# Patient Record
Sex: Female | Born: 1995 | Race: Black or African American | Hispanic: No | Marital: Single | Smoking: Former smoker
Health system: Southern US, Community
[De-identification: ages and names within clinical notes are randomized; demographics above are authoritative.]

## PROBLEM LIST (undated history)

## (undated) DIAGNOSIS — O139 Gestational [pregnancy-induced] hypertension without significant proteinuria, unspecified trimester: Secondary | ICD-10-CM

## (undated) DIAGNOSIS — I10 Essential (primary) hypertension: Secondary | ICD-10-CM

---
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
## 2019-11-07 LAB — OB RESULTS CONSOLE ABO/RH: RH Type: POSITIVE

## 2019-11-07 LAB — OB RESULTS CONSOLE GC/CHLAMYDIA
Chlamydia: NEGATIVE
Gonorrhea: NEGATIVE

## 2019-11-07 LAB — OB RESULTS CONSOLE RUBELLA ANTIBODY, IGM: Rubella: IMMUNE

## 2019-11-07 LAB — OB RESULTS CONSOLE ANTIBODY SCREEN: Antibody Screen: NEGATIVE

## 2019-11-07 LAB — OB RESULTS CONSOLE HIV ANTIBODY (ROUTINE TESTING): HIV: NONREACTIVE

## 2019-11-07 LAB — OB RESULTS CONSOLE HEPATITIS B SURFACE ANTIGEN: Hepatitis B Surface Ag: NEGATIVE

## 2019-11-07 LAB — OB RESULTS CONSOLE RPR: RPR: NONREACTIVE

## 2019-11-19 ENCOUNTER — Other Ambulatory Visit (HOSPITAL_COMMUNITY): Payer: Self-pay | Admitting: Obstetrics and Gynecology

## 2019-11-19 DIAGNOSIS — Z3A19 19 weeks gestation of pregnancy: Secondary | ICD-10-CM

## 2019-11-19 DIAGNOSIS — Z3689 Encounter for other specified antenatal screening: Secondary | ICD-10-CM

## 2019-12-17 ENCOUNTER — Ambulatory Visit (HOSPITAL_COMMUNITY): Payer: Medicaid Other | Attending: Obstetrics and Gynecology

## 2019-12-17 ENCOUNTER — Other Ambulatory Visit: Payer: Self-pay

## 2019-12-17 DIAGNOSIS — Z3A19 19 weeks gestation of pregnancy: Secondary | ICD-10-CM | POA: Diagnosis not present

## 2019-12-17 DIAGNOSIS — Z363 Encounter for antenatal screening for malformations: Secondary | ICD-10-CM

## 2019-12-17 DIAGNOSIS — Z3689 Encounter for other specified antenatal screening: Secondary | ICD-10-CM | POA: Diagnosis not present

## 2020-04-23 ENCOUNTER — Other Ambulatory Visit: Payer: Self-pay

## 2020-04-23 ENCOUNTER — Encounter (HOSPITAL_COMMUNITY): Payer: Self-pay | Admitting: Obstetrics & Gynecology

## 2020-04-23 ENCOUNTER — Inpatient Hospital Stay (HOSPITAL_COMMUNITY)
Admission: AD | Admit: 2020-04-23 | Discharge: 2020-04-26 | DRG: 788 | Disposition: A | Discharge status: Home / Self Care | Payer: Medicaid Other | Attending: Obstetrics & Gynecology | Admitting: Obstetrics & Gynecology

## 2020-04-23 DIAGNOSIS — Z20822 Contact with and (suspected) exposure to covid-19: Secondary | ICD-10-CM | POA: Diagnosis present

## 2020-04-23 DIAGNOSIS — Z98891 History of uterine scar from previous surgery: Secondary | ICD-10-CM

## 2020-04-23 DIAGNOSIS — O9081 Anemia of the puerperium: Secondary | ICD-10-CM | POA: Diagnosis not present

## 2020-04-23 DIAGNOSIS — O9902 Anemia complicating childbirth: Secondary | ICD-10-CM

## 2020-04-23 DIAGNOSIS — Z3A37 37 weeks gestation of pregnancy: Secondary | ICD-10-CM

## 2020-04-23 DIAGNOSIS — O1414 Severe pre-eclampsia complicating childbirth: Principal | ICD-10-CM | POA: Diagnosis present

## 2020-04-23 DIAGNOSIS — O99214 Obesity complicating childbirth: Secondary | ICD-10-CM | POA: Diagnosis present

## 2020-04-23 DIAGNOSIS — Z87891 Personal history of nicotine dependence: Secondary | ICD-10-CM | POA: Diagnosis not present

## 2020-04-23 DIAGNOSIS — Z3490 Encounter for supervision of normal pregnancy, unspecified, unspecified trimester: Secondary | ICD-10-CM | POA: Diagnosis present

## 2020-04-23 DIAGNOSIS — O134 Gestational [pregnancy-induced] hypertension without significant proteinuria, complicating childbirth: Secondary | ICD-10-CM | POA: Diagnosis present

## 2020-04-23 DIAGNOSIS — O1490 Unspecified pre-eclampsia, unspecified trimester: Secondary | ICD-10-CM | POA: Diagnosis present

## 2020-04-23 LAB — COMPREHENSIVE METABOLIC PANEL
ALT: 17 U/L (ref 0–44)
AST: 22 U/L (ref 15–41)
Albumin: 2.5 g/dL — ABNORMAL LOW (ref 3.5–5.0)
Alkaline Phosphatase: 155 U/L — ABNORMAL HIGH (ref 38–126)
Anion gap: 10 (ref 5–15)
BUN: 7 mg/dL (ref 6–20)
CO2: 20 mmol/L — ABNORMAL LOW (ref 22–32)
Calcium: 8.7 mg/dL — ABNORMAL LOW (ref 8.9–10.3)
Chloride: 105 mmol/L (ref 98–111)
Creatinine, Ser: 0.9 mg/dL (ref 0.44–1.00)
GFR calc Af Amer: >60 mL/min (ref >60)
GFR calc non Af Amer: >60 mL/min (ref >60)
Glucose, Bld: 92 mg/dL (ref 70–99)
Potassium: 3.4 mmol/L — ABNORMAL LOW (ref 3.5–5.1)
Sodium: 135 mmol/L (ref 135–145)
Total Bilirubin: 0.4 mg/dL (ref 0.3–1.2)
Total Protein: 5.9 g/dL — ABNORMAL LOW (ref 6.5–8.1)

## 2020-04-23 LAB — TYPE AND SCREEN
ABO/RH(D): B POS
Antibody Screen: NEGATIVE

## 2020-04-23 LAB — CBC
HCT: 32.9 % — ABNORMAL LOW (ref 36.0–46.0)
Hemoglobin: 10.8 g/dL — ABNORMAL LOW (ref 12.0–15.0)
MCH: 30.6 pg (ref 26.0–34.0)
MCHC: 32.8 g/dL (ref 30.0–36.0)
MCV: 93.2 fL (ref 80.0–100.0)
Platelets: 238 10*3/uL (ref 150–400)
RBC: 3.53 MIL/uL — ABNORMAL LOW (ref 3.87–5.11)
RDW: 13.2 % (ref 11.5–15.5)
WBC: 14.6 10*3/uL — ABNORMAL HIGH (ref 4.0–10.5)
nRBC: 0.1 % (ref 0.0–0.2)

## 2020-04-23 LAB — RESPIRATORY PANEL BY RT PCR (FLU A&B, COVID)
Influenza A by PCR: NEGATIVE
Influenza B by PCR: NEGATIVE
SARS Coronavirus 2 by RT PCR: NEGATIVE

## 2020-04-23 LAB — URIC ACID: Uric Acid, Serum: 7.1 mg/dL (ref 2.5–7.1)

## 2020-04-23 MED ORDER — OXYTOCIN-SODIUM CHLORIDE 30-0.9 UT/500ML-% IV SOLN: 2.5000 [IU]/h | INTRAVENOUS | Status: DC

## 2020-04-23 MED ORDER — SOD CITRATE-CITRIC ACID 500-334 MG/5ML PO SOLN
30.0000 mL | ORAL | Status: DC | PRN
  Administered 2020-04-24: 30 mL via ORAL
  Filled 2020-04-23: qty 15

## 2020-04-23 MED ORDER — TERBUTALINE SULFATE 1 MG/ML IJ SOLN
0.2500 mg | Freq: Once | INTRAMUSCULAR | Status: DC | PRN
  Filled 2020-04-23: qty 1

## 2020-04-23 MED ORDER — SODIUM CHLORIDE 0.9 % IV SOLN: 250.0000 mL | INTRAVENOUS | Status: DC | PRN

## 2020-04-23 MED ORDER — MAGNESIUM SULFATE 40 GM/1000ML IV SOLN
2.0000 g/h | INTRAVENOUS | Status: AC
  Administered 2020-04-23 – 2020-04-25 (×3): 2 g/h via INTRAVENOUS
  Filled 2020-04-23 (×2): qty 1000

## 2020-04-23 MED ORDER — SODIUM CHLORIDE 0.9% FLUSH: 3.0000 mL | Freq: Two times a day (BID) | INTRAVENOUS | Status: DC

## 2020-04-23 MED ORDER — OXYTOCIN 10 UNIT/ML IJ SOLN: 10.0000 [IU] | Freq: Once | INTRAMUSCULAR | Status: DC

## 2020-04-23 MED ORDER — MAGNESIUM SULFATE 40 GM/1000ML IV SOLN
INTRAVENOUS | Status: AC
  Administered 2020-04-23: 4 g via INTRAVENOUS
  Filled 2020-04-23: qty 1000

## 2020-04-23 MED ORDER — HYDRALAZINE HCL 20 MG/ML IJ SOLN
10.0000 mg | INTRAMUSCULAR | Status: DC | PRN
  Administered 2020-04-23: 10 mg via INTRAVENOUS
  Filled 2020-04-23: qty 1

## 2020-04-23 MED ORDER — LIDOCAINE HCL (PF) 1 % IJ SOLN: 30.0000 mL | INTRAMUSCULAR | Status: DC | PRN

## 2020-04-23 MED ORDER — OXYTOCIN-SODIUM CHLORIDE 30-0.9 UT/500ML-% IV SOLN
1.0000 m[IU]/min | INTRAVENOUS | Status: DC
  Administered 2020-04-24: 2 m[IU]/min via INTRAVENOUS
  Filled 2020-04-23: qty 500

## 2020-04-23 MED ORDER — OXYTOCIN BOLUS FROM INFUSION: 333.0000 mL | Freq: Once | INTRAVENOUS | Status: DC

## 2020-04-23 MED ORDER — LABETALOL HCL 5 MG/ML IV SOLN
20.0000 mg | INTRAVENOUS | Status: DC | PRN
  Administered 2020-04-23: 20 mg via INTRAVENOUS
  Filled 2020-04-23: qty 4

## 2020-04-23 MED ORDER — LABETALOL HCL 5 MG/ML IV SOLN
40.0000 mg | INTRAVENOUS | Status: DC | PRN
  Administered 2020-04-23: 40 mg via INTRAVENOUS
  Filled 2020-04-23: qty 8

## 2020-04-23 MED ORDER — MAGNESIUM SULFATE BOLUS VIA INFUSION
4.0000 g | Freq: Once | INTRAVENOUS | Status: AC
  Filled 2020-04-23: qty 1000

## 2020-04-23 MED ORDER — SODIUM CHLORIDE 0.9% FLUSH: 3.0000 mL | INTRAVENOUS | Status: DC | PRN

## 2020-04-23 MED ORDER — LACTATED RINGERS IV SOLN
INTRAVENOUS | Status: DC
  Administered 2020-04-24: 75 mL via INTRAVENOUS

## 2020-04-23 MED ORDER — LACTATED RINGERS IV SOLN: 500.0000 mL | INTRAVENOUS | Status: DC | PRN

## 2020-04-23 MED ORDER — MISOPROSTOL 50MCG HALF TABLET
50.0000 ug | ORAL_TABLET | ORAL | Status: DC
  Administered 2020-04-23 (×2): 50 ug via ORAL
  Filled 2020-04-23 (×2): qty 1

## 2020-04-23 MED ORDER — LABETALOL HCL 5 MG/ML IV SOLN: 80.0000 mg | INTRAVENOUS | Status: DC | PRN

## 2020-04-23 MED ORDER — ONDANSETRON HCL 4 MG/2ML IJ SOLN: 4.0000 mg | Freq: Four times a day (QID) | INTRAMUSCULAR | Status: DC | PRN

## 2020-04-23 MED ORDER — FENTANYL CITRATE (PF) 100 MCG/2ML IJ SOLN
50.0000 ug | INTRAMUSCULAR | Status: DC | PRN
  Administered 2020-04-23: 100 ug via INTRAVENOUS
  Filled 2020-04-23: qty 2

## 2020-04-23 MED ORDER — ACETAMINOPHEN 500 MG PO TABS
1000.0000 mg | ORAL_TABLET | Freq: Four times a day (QID) | ORAL | Status: DC | PRN
  Administered 2020-04-24 (×2): 1000 mg via ORAL
  Filled 2020-04-23 (×2): qty 2

## 2020-04-23 NOTE — Progress Notes (Signed)
S: Feeling well with minimal pain. Discussed magnesium sulfate for severe range BP and patient consents.   O: Vitals:   04/23/20 2030 04/23/20 2033 04/23/20 2045 04/23/20 2101  BP: (!) 154/101 (!) 159/91 (!) 167/99 (!) 173/93  Pulse: 92 96 97 (!) 106  Resp: 18     Temp: 98.1 F (36.7 C)     TempSrc: Axillary     Weight:      Height:       Severe range BP treated with Labetalol 20mg , 40mg , and Hydralazine 10mg .   FHT:  FHR: 135 bpm, variability: moderate,  accelerations:  Present,  decelerations:  Absent UC:   irregular, every 2-4 minutes SVE:   Dilation: 2.5 Effacement (%): 50 Station: -2 Exam by:: Libra Gatz cnm  Results for orders placed or performed during the hospital encounter of 04/23/20 (from the past 24 hour(s))  CBC     Status: Abnormal   Collection Time: 04/23/20  4:48 PM  Result Value Ref Range   WBC 14.6 (H) 4.0 - 10.5 K/uL   RBC 3.53 (L) 3.87 - 5.11 MIL/uL   Hemoglobin 10.8 (L) 12.0 - 15.0 g/dL   HCT 002.002.002.002 (L) 36 - 46 %   MCV 93.2 80.0 - 100.0 fL   MCH 30.6 26.0 - 34.0 pg   MCHC 32.8 30.0 - 36.0 g/dL   RDW 04/25/20 04/25/20 - 91.9 %   Platelets 238 150 - 400 K/uL   nRBC 0.1 0.0 - 0.2 %  Type and screen [facility]     Status: None   Collection Time: 04/23/20  4:48 PM  Result Value Ref Range   ABO/RH(D) B POS    Antibody Screen NEG    Sample Expiration      04/26/2020,2359 Performed at [facility], [address] [place], [facility] [address]   Respiratory Panel by RT PCR (Flu A&B, Covid) - Nasopharyngeal Swab     Status: None   Collection Time: 04/23/20  5:15 PM   Specimen: Nasopharyngeal Swab  Result Value Ref Range   SARS Coronavirus 2 by RT PCR NEGATIVE NEGATIVE   Influenza A by PCR NEGATIVE NEGATIVE   Influenza B by PCR NEGATIVE NEGATIVE  Comprehensive metabolic panel     Status: Abnormal   Collection Time: 04/23/20  7:06 PM  Result Value Ref Range   Sodium 135 135 - 145 mmol/L   Potassium 3.4 (L) 3.5 - 5.1 mmol/L   Chloride  105 98 - 111 mmol/L   CO2 20 (L) 22 - 32 mmol/L   Glucose, Bld 92 70 - 99 mg/dL   BUN 7 6 - 20 mg/dL   Creatinine, Ser 02548 0.44 - 1.00 mg/dL   Calcium 8.7 (L) 8.9 - 10.3 mg/dL   Total Protein 5.9 (L) 6.5 - 8.1 g/dL   Albumin 2.5 (L) 3.5 - 5.0 g/dL   AST 22 15 - 41 U/L   ALT 17 0 - 44 U/L   Alkaline Phosphatase 155 (H) 38 - 126 U/L   Total Bilirubin 0.4 0.3 - 1.2 mg/dL   GFR calc non Af Amer >60 >60 mL/min   GFR calc Af Amer >60 >60 mL/min   Anion gap 10 5 - 15  Uric acid     Status: None   Collection Time: 04/23/20  7:06 PM  Result Value Ref Range   Uric Acid, Serum 7.1 2.5 - 7.1 mg/dL   A / P: Induction of labor due to preeclampsia, foley balloon in  place, buccal Cytotec  Fetal Wellbeing:  Category I Pain Control:  IV pain meds Preeclampsia: Start Mag sulfate with 4g bolus and 2g/hr, labetalol protocol for severe range BP, uric acid from 6.9 to 7.1, platelets and CMP WNL, last PCR 0.225 yesterday Anticipated MOD:  NSVD   Foley balloon in place. Continue of buccal Cytotec every 4 hours for cervical ripening.   June Leap, CNM, MSN 04/23/2020, 9:30 PM

## 2020-04-23 NOTE — H&P (Signed)
OB ADMISSION/ HISTORY & PHYSICAL:  Admission Date: 04/23/2020  3:59 PM  Admit Diagnosis: Encounter for induction of labor [Z34.90]    Rebecca Banks is a 24 y.o. female presenting for IOL due to gestational hypertension. Patient was seen in the office yesterday with elevated BP. PIH labs drawn. Uric acid elevated 6.9, PCR 0.225. Patient returned to office today and had repeat elevated blood pressure and she was sent to the hospital for IOL. Denies headache, epigastric pain, or visual changes. Denies contractions, leaking of fluid, or vaginal bleeding. Endorses + fetal movement. Expecting a baby boy, Rajon.   Prenatal History: No obstetric history on file.   EDC : 05/12/2020  Prenatal care at [facility] since 13 weeks   Prenatal course complicated by: 1. Gestational hypertension  Prenatal Labs: ABO, Rh: B (04/09 0000)  Antibody: NEG (09/24 1648) Rubella: Immune (04/09 0000)  RPR: Nonreactive (04/09 0000)  HBsAg: Negative (04/09 0000)  HIV: Non-reactive (04/09 0000)  GBS:   Negative 1 hr Glucola : 103 Genetic Screening: Panorama low risk female Ultrasound: normal anatomy, GROWTH on 8/12--EFW 4 LBS, 58%ILE, VTX, POSTERIOR PLACENTA, CERVIX CLOSED/4.9 CM LONG, AFI 17.3    Maternal Diabetes: No Genetic Screening: Normal Maternal Ultrasounds/Referrals: Normal Fetal Ultrasounds or other Referrals:  None Maternal Substance Abuse:  No Significant Maternal Medications:  None Significant Maternal Lab Results:  Group B Strep negative Other Comments:  None  Medical / Surgical History :  Past medical history: History reviewed. No pertinent past medical history.   Past surgical history: History reviewed. No pertinent surgical history.   Family History: History reviewed. No pertinent family history.   Social History:  reports that she has quit smoking. She does not have any smokeless tobacco history on file. She reports previous alcohol use. She reports current drug use. Drug:  Marijuana.  Allergies: Patient has no known allergies.   Current Medications at time of admission:  Medications Prior to Admission  Medication Sig Dispense Refill Last Dose  . Prenatal Vit-Fe Fumarate-FA (PRENATAL MULTIVITAMIN) TABS tablet Take 1 tablet by mouth daily at 12 noon.   04/23/2020 at Unknown time    Review of Systems: Review of Systems  All other systems reviewed and are negative.   Physical Exam: Vital signs and nursing notes reviewed.  Patient Vitals for the past 24 hrs:  BP Temp Temp src Pulse Resp Height Weight  04/23/20 1845 (!) 164/104 -- -- 89 18 -- --  04/23/20 1830 (!) 162/102 -- -- 93 -- -- --  04/23/20 1827 (!) 158/100 -- -- 90 18 -- --  04/23/20 1824 (!) 173/104 -- -- 87 -- -- --  04/23/20 1714 (!) 146/103 -- -- 94 -- -- --  04/23/20 1640 (!) 160/101 98.2 F (36.8 C) Axillary (!) 103 18 -- --  04/23/20 1625 (!) 156/101 -- -- (!) 104 16 -- --  04/23/20 1607 -- -- -- -- -- 5\' 4"  (1.626 m) 101.2 kg    General: AAO x 3, NAD Heart: RRR Lungs:CTAB Abdomen: Gravid, NT Extremities: no edema Genitalia / VE: Dilation: 2.5 Effacement (%): 50 Station: -2 Presentation: Vertex Exam by:: Laurrie Toppin cnm   Foley balloon placed without difficulty  FHR: 135BPM, mod variability, + accels, no decels TOCO: Ctx q 1.5-5.5  Labs:   Pending T&S, CBC, RPR  Recent Labs    04/23/20 1648  WBC 14.6*  HGB 10.8*  HCT 32.9*  PLT 238   Assessment:  23 y.o. G2P0, [redacted]w[redacted]d, GHTN  1. Induction of labor 2.  FHR category 1 3. GBS negative 4. Desires epidural 5. Plans to breastfeed  Plan:  1. Admit to BS 2. Routine L&D orders 3. Repeat CMP and uric acid 4. Labetalol protocol for severe range BP 5. Foley balloon in place 6. Cytotec buccal for cervical ripening 7. Analgesia/anesthesia PRN  8. Anticipate NSVB  Dr. Richardson Dopp notified of admission / plan of care  June Leap CNM, MSN 04/23/2020, 7:11 PM

## 2020-04-24 ENCOUNTER — Inpatient Hospital Stay (HOSPITAL_COMMUNITY): Payer: Medicaid Other | Admitting: Anesthesiology

## 2020-04-24 ENCOUNTER — Encounter (HOSPITAL_COMMUNITY)
Admission: AD | Disposition: A | Discharge status: Home / Self Care | Payer: Self-pay | Source: Home / Self Care | Attending: Obstetrics & Gynecology

## 2020-04-24 LAB — CBC
HCT: 36.3 % (ref 36.0–46.0)
Hemoglobin: 12 g/dL (ref 12.0–15.0)
MCH: 30.2 pg (ref 26.0–34.0)
MCHC: 33.1 g/dL (ref 30.0–36.0)
MCV: 91.4 fL (ref 80.0–100.0)
Platelets: 269 10*3/uL (ref 150–400)
RBC: 3.97 MIL/uL (ref 3.87–5.11)
RDW: 13.2 % (ref 11.5–15.5)
WBC: 17.9 10*3/uL — ABNORMAL HIGH (ref 4.0–10.5)
nRBC: 0.2 % (ref 0.0–0.2)

## 2020-04-24 LAB — RPR: RPR Ser Ql: NONREACTIVE

## 2020-04-24 LAB — MAGNESIUM: Magnesium: 4.8 mg/dL — ABNORMAL HIGH (ref 1.7–2.4)

## 2020-04-24 SURGERY — Surgical Case
Anesthesia: Epidural | Wound class: Clean Contaminated

## 2020-04-24 MED ORDER — EPHEDRINE 5 MG/ML INJ: 10.0000 mg | INTRAVENOUS | Status: DC | PRN

## 2020-04-24 MED ORDER — OXYCODONE HCL 5 MG PO TABS
5.0000 mg | ORAL_TABLET | ORAL | Status: DC | PRN
  Administered 2020-04-25: 5 mg via ORAL
  Administered 2020-04-26: 10 mg via ORAL
  Filled 2020-04-24: qty 1
  Filled 2020-04-24: qty 2

## 2020-04-24 MED ORDER — SCOPOLAMINE 1 MG/3DAYS TD PT72
MEDICATED_PATCH | TRANSDERMAL | Status: AC
  Filled 2020-04-24: qty 1

## 2020-04-24 MED ORDER — NALBUPHINE HCL 10 MG/ML IJ SOLN: 5.0000 mg | INTRAMUSCULAR | Status: DC | PRN

## 2020-04-24 MED ORDER — LABETALOL HCL 5 MG/ML IV SOLN
INTRAVENOUS | Status: DC | PRN
  Administered 2020-04-24 (×2): 5 mg via INTRAVENOUS

## 2020-04-24 MED ORDER — WITCH HAZEL-GLYCERIN EX PADS: 1.0000 "application " | MEDICATED_PAD | CUTANEOUS | Status: DC | PRN

## 2020-04-24 MED ORDER — PROMETHAZINE HCL 25 MG/ML IJ SOLN
INTRAMUSCULAR | Status: AC
  Filled 2020-04-24: qty 1

## 2020-04-24 MED ORDER — SIMETHICONE 80 MG PO CHEW
80.0000 mg | CHEWABLE_TABLET | ORAL | Status: DC
  Administered 2020-04-25 (×2): 80 mg via ORAL
  Filled 2020-04-24 (×2): qty 1

## 2020-04-24 MED ORDER — MORPHINE SULFATE (PF) 0.5 MG/ML IJ SOLN
INTRAMUSCULAR | Status: AC
  Filled 2020-04-24: qty 10

## 2020-04-24 MED ORDER — SODIUM CHLORIDE 0.9% FLUSH: 3.0000 mL | INTRAVENOUS | Status: DC | PRN

## 2020-04-24 MED ORDER — LIDOCAINE HCL (PF) 2 % IJ SOLN
INTRAMUSCULAR | Status: AC
  Filled 2020-04-24: qty 5

## 2020-04-24 MED ORDER — ONDANSETRON HCL 4 MG/2ML IJ SOLN
INTRAMUSCULAR | Status: DC | PRN
  Administered 2020-04-24: 4 mg via INTRAVENOUS

## 2020-04-24 MED ORDER — HYDROMORPHONE HCL 1 MG/ML IJ SOLN: 0.2000 mg | INTRAMUSCULAR | Status: DC | PRN

## 2020-04-24 MED ORDER — CARBOPROST TROMETHAMINE 250 MCG/ML IM SOLN
INTRAMUSCULAR | Status: DC | PRN
  Administered 2020-04-24: 250 ug via INTRAMUSCULAR

## 2020-04-24 MED ORDER — SODIUM BICARBONATE 8.4 % IV SOLN
INTRAVENOUS | Status: AC
  Filled 2020-04-24: qty 50

## 2020-04-24 MED ORDER — ACETAMINOPHEN 325 MG PO TABS: 650.0000 mg | ORAL_TABLET | ORAL | Status: DC | PRN

## 2020-04-24 MED ORDER — ZOLPIDEM TARTRATE 5 MG PO TABS: 5.0000 mg | ORAL_TABLET | Freq: Every evening | ORAL | Status: DC | PRN

## 2020-04-24 MED ORDER — NALOXONE HCL 4 MG/10ML IJ SOLN
1.0000 ug/kg/h | INTRAVENOUS | Status: DC | PRN
  Filled 2020-04-24: qty 5

## 2020-04-24 MED ORDER — SODIUM CHLORIDE 0.9 % IR SOLN
Status: DC | PRN
  Administered 2020-04-24: 1000 mL

## 2020-04-24 MED ORDER — ACETAMINOPHEN 500 MG PO TABS
1000.0000 mg | ORAL_TABLET | Freq: Four times a day (QID) | ORAL | Status: AC
  Administered 2020-04-25 (×4): 1000 mg via ORAL
  Filled 2020-04-24 (×4): qty 2

## 2020-04-24 MED ORDER — COCONUT OIL OIL: 1.0000 "application " | TOPICAL_OIL | Status: DC | PRN

## 2020-04-24 MED ORDER — FENTANYL-BUPIVACAINE-NACL 0.5-0.125-0.9 MG/250ML-% EP SOLN
12.0000 mL/h | EPIDURAL | Status: DC | PRN
  Filled 2020-04-24: qty 250

## 2020-04-24 MED ORDER — PHENYLEPHRINE 40 MCG/ML (10ML) SYRINGE FOR IV PUSH (FOR BLOOD PRESSURE SUPPORT): 80.0000 ug | PREFILLED_SYRINGE | INTRAVENOUS | Status: DC | PRN

## 2020-04-24 MED ORDER — PHENYLEPHRINE 40 MCG/ML (10ML) SYRINGE FOR IV PUSH (FOR BLOOD PRESSURE SUPPORT)
80.0000 ug | PREFILLED_SYRINGE | INTRAVENOUS | Status: DC | PRN
  Filled 2020-04-24: qty 10

## 2020-04-24 MED ORDER — LACTATED RINGERS IV SOLN: INTRAVENOUS | Status: DC

## 2020-04-24 MED ORDER — MEPERIDINE HCL 25 MG/ML IJ SOLN: 6.2500 mg | INTRAMUSCULAR | Status: DC | PRN

## 2020-04-24 MED ORDER — CARBOPROST TROMETHAMINE 250 MCG/ML IM SOLN
INTRAMUSCULAR | Status: AC
  Filled 2020-04-24: qty 1

## 2020-04-24 MED ORDER — LIDOCAINE 2% (20 MG/ML) 5 ML SYRINGE
INTRAMUSCULAR | Status: AC
  Filled 2020-04-24: qty 5

## 2020-04-24 MED ORDER — LACTATED RINGERS IV SOLN: 500.0000 mL | Freq: Once | INTRAVENOUS | Status: DC

## 2020-04-24 MED ORDER — DIPHENHYDRAMINE HCL 25 MG PO CAPS: 25.0000 mg | ORAL_CAPSULE | Freq: Four times a day (QID) | ORAL | Status: DC | PRN

## 2020-04-24 MED ORDER — SIMETHICONE 80 MG PO CHEW: 80.0000 mg | CHEWABLE_TABLET | ORAL | Status: DC | PRN

## 2020-04-24 MED ORDER — OXYTOCIN-SODIUM CHLORIDE 30-0.9 UT/500ML-% IV SOLN
2.5000 [IU]/h | INTRAVENOUS | Status: AC
  Administered 2020-04-25: 2.5 [IU]/h via INTRAVENOUS
  Filled 2020-04-24: qty 500

## 2020-04-24 MED ORDER — PROMETHAZINE HCL 25 MG/ML IJ SOLN
INTRAMUSCULAR | Status: DC | PRN
  Administered 2020-04-24: 12.5 mg via INTRAVENOUS

## 2020-04-24 MED ORDER — HYDROMORPHONE HCL 1 MG/ML IJ SOLN
INTRAMUSCULAR | Status: AC
  Filled 2020-04-24: qty 0.5

## 2020-04-24 MED ORDER — SIMETHICONE 80 MG PO CHEW
80.0000 mg | CHEWABLE_TABLET | Freq: Three times a day (TID) | ORAL | Status: DC
  Administered 2020-04-25 – 2020-04-26 (×5): 80 mg via ORAL
  Filled 2020-04-24 (×5): qty 1

## 2020-04-24 MED ORDER — FENTANYL-BUPIVACAINE-NACL 0.5-0.125-0.9 MG/250ML-% EP SOLN: 12.0000 mL/h | EPIDURAL | Status: DC | PRN

## 2020-04-24 MED ORDER — LIDOCAINE HCL (PF) 1 % IJ SOLN
INTRAMUSCULAR | Status: DC | PRN
  Administered 2020-04-24: 6 mL via EPIDURAL

## 2020-04-24 MED ORDER — KETOROLAC TROMETHAMINE 30 MG/ML IJ SOLN: 30.0000 mg | Freq: Once | INTRAMUSCULAR | Status: AC | PRN

## 2020-04-24 MED ORDER — KETOROLAC TROMETHAMINE 30 MG/ML IJ SOLN
30.0000 mg | Freq: Once | INTRAMUSCULAR | Status: AC
  Administered 2020-04-25: 30 mg via INTRAVENOUS

## 2020-04-24 MED ORDER — IBUPROFEN 800 MG PO TABS
800.0000 mg | ORAL_TABLET | Freq: Three times a day (TID) | ORAL | Status: DC
  Administered 2020-04-25 – 2020-04-26 (×4): 800 mg via ORAL
  Filled 2020-04-24 (×4): qty 1

## 2020-04-24 MED ORDER — MENTHOL 3 MG MT LOZG: 1.0000 | LOZENGE | OROMUCOSAL | Status: DC | PRN

## 2020-04-24 MED ORDER — SCOPOLAMINE 1 MG/3DAYS TD PT72
MEDICATED_PATCH | TRANSDERMAL | Status: DC | PRN
  Administered 2020-04-24: 1 via TRANSDERMAL

## 2020-04-24 MED ORDER — PROMETHAZINE HCL 25 MG/ML IJ SOLN: 6.2500 mg | INTRAMUSCULAR | Status: DC | PRN

## 2020-04-24 MED ORDER — OXYCODONE HCL 5 MG PO TABS: 5.0000 mg | ORAL_TABLET | Freq: Once | ORAL | Status: DC | PRN

## 2020-04-24 MED ORDER — KETOROLAC TROMETHAMINE 30 MG/ML IJ SOLN: 30.0000 mg | Freq: Four times a day (QID) | INTRAMUSCULAR | Status: AC | PRN

## 2020-04-24 MED ORDER — LABETALOL HCL 5 MG/ML IV SOLN
INTRAVENOUS | Status: AC
  Filled 2020-04-24: qty 4

## 2020-04-24 MED ORDER — CEFAZOLIN SODIUM-DEXTROSE 2-4 GM/100ML-% IV SOLN
INTRAVENOUS | Status: AC
  Filled 2020-04-24: qty 100

## 2020-04-24 MED ORDER — NALBUPHINE HCL 10 MG/ML IJ SOLN: 5.0000 mg | Freq: Once | INTRAMUSCULAR | Status: DC | PRN

## 2020-04-24 MED ORDER — SCOPOLAMINE 1 MG/3DAYS TD PT72: 1.0000 | MEDICATED_PATCH | Freq: Once | TRANSDERMAL | Status: DC

## 2020-04-24 MED ORDER — DIPHENHYDRAMINE HCL 50 MG/ML IJ SOLN: 12.5000 mg | INTRAMUSCULAR | Status: DC | PRN

## 2020-04-24 MED ORDER — HYDROMORPHONE HCL 1 MG/ML IJ SOLN
0.2500 mg | INTRAMUSCULAR | Status: DC | PRN
  Administered 2020-04-24 – 2020-04-25 (×2): 0.5 mg via INTRAVENOUS

## 2020-04-24 MED ORDER — OXYTOCIN-SODIUM CHLORIDE 30-0.9 UT/500ML-% IV SOLN
INTRAVENOUS | Status: DC | PRN
  Administered 2020-04-24: 500 mL via INTRAVENOUS

## 2020-04-24 MED ORDER — FERROUS SULFATE 325 (65 FE) MG PO TABS
325.0000 mg | ORAL_TABLET | Freq: Two times a day (BID) | ORAL | Status: DC
  Administered 2020-04-25: 325 mg via ORAL
  Filled 2020-04-24 (×2): qty 1

## 2020-04-24 MED ORDER — PROMETHAZINE HCL 25 MG/ML IJ SOLN: INTRAMUSCULAR | Status: DC | PRN

## 2020-04-24 MED ORDER — BUPIVACAINE HCL (PF) 0.75 % IJ SOLN
INTRAMUSCULAR | Status: DC | PRN
Start: 2020-04-24 — End: 2020-04-24
  Administered 2020-04-24: 12 mL/h via EPIDURAL

## 2020-04-24 MED ORDER — LIDOCAINE-EPINEPHRINE (PF) 2 %-1:200000 IJ SOLN
INTRAMUSCULAR | Status: DC | PRN
  Administered 2020-04-24: 10 mL via EPIDURAL

## 2020-04-24 MED ORDER — NALOXONE HCL 0.4 MG/ML IJ SOLN: 0.4000 mg | INTRAMUSCULAR | Status: DC | PRN

## 2020-04-24 MED ORDER — SENNOSIDES-DOCUSATE SODIUM 8.6-50 MG PO TABS
2.0000 | ORAL_TABLET | ORAL | Status: DC
  Administered 2020-04-25 (×2): 2 via ORAL
  Filled 2020-04-24 (×2): qty 2

## 2020-04-24 MED ORDER — OXYCODONE HCL 5 MG/5ML PO SOLN: 5.0000 mg | Freq: Once | ORAL | Status: DC | PRN

## 2020-04-24 MED ORDER — MEPERIDINE HCL 25 MG/ML IJ SOLN
INTRAMUSCULAR | Status: DC | PRN
  Administered 2020-04-24 (×2): 12.5 mg via INTRAVENOUS

## 2020-04-24 MED ORDER — ONDANSETRON HCL 4 MG/2ML IJ SOLN: 4.0000 mg | Freq: Three times a day (TID) | INTRAMUSCULAR | Status: DC | PRN

## 2020-04-24 MED ORDER — EPINEPHRINE PF 1 MG/ML IJ SOLN
INTRAMUSCULAR | Status: AC
  Filled 2020-04-24: qty 1

## 2020-04-24 MED ORDER — MEPERIDINE HCL 25 MG/ML IJ SOLN
INTRAMUSCULAR | Status: AC
  Filled 2020-04-24: qty 1

## 2020-04-24 MED ORDER — DIBUCAINE (PERIANAL) 1 % EX OINT: 1.0000 "application " | TOPICAL_OINTMENT | CUTANEOUS | Status: DC | PRN

## 2020-04-24 MED ORDER — PHENYLEPHRINE 40 MCG/ML (10ML) SYRINGE FOR IV PUSH (FOR BLOOD PRESSURE SUPPORT)
PREFILLED_SYRINGE | INTRAVENOUS | Status: AC
  Filled 2020-04-24: qty 10

## 2020-04-24 MED ORDER — PRENATAL MULTIVITAMIN CH
1.0000 | ORAL_TABLET | Freq: Every day | ORAL | Status: DC
  Administered 2020-04-25 – 2020-04-26 (×2): 1 via ORAL
  Filled 2020-04-24 (×2): qty 1

## 2020-04-24 MED ORDER — CEFAZOLIN SODIUM-DEXTROSE 2-3 GM-%(50ML) IV SOLR
INTRAVENOUS | Status: DC | PRN
  Administered 2020-04-24: 2 g via INTRAVENOUS

## 2020-04-24 MED ORDER — ONDANSETRON HCL 4 MG/2ML IJ SOLN
INTRAMUSCULAR | Status: AC
  Filled 2020-04-24: qty 2

## 2020-04-24 MED ORDER — PHENYLEPHRINE 40 MCG/ML (10ML) SYRINGE FOR IV PUSH (FOR BLOOD PRESSURE SUPPORT)
80.0000 ug | PREFILLED_SYRINGE | INTRAVENOUS | Status: AC | PRN
  Administered 2020-04-24 (×3): 80 ug via INTRAVENOUS

## 2020-04-24 MED ORDER — DIPHENHYDRAMINE HCL 25 MG PO CAPS: 25.0000 mg | ORAL_CAPSULE | ORAL | Status: DC | PRN

## 2020-04-24 SURGICAL SUPPLY — 43 items
APL SKNCLS STERI-STRIP NONHPOA (GAUZE/BANDAGES/DRESSINGS) ×1
BARRIER ADHS 3X4 INTERCEED (GAUZE/BANDAGES/DRESSINGS) ×3 IMPLANT
BENZOIN TINCTURE PRP APPL 2/3 (GAUZE/BANDAGES/DRESSINGS) ×3 IMPLANT
BRR ADH 4X3 ABS CNTRL BYND (GAUZE/BANDAGES/DRESSINGS) ×1
CHLORAPREP W/TINT 26ML (MISCELLANEOUS) ×3 IMPLANT
CLAMP CORD UMBIL (MISCELLANEOUS) IMPLANT
CLOSURE STERI STRIP 1/2 X4 (GAUZE/BANDAGES/DRESSINGS) ×2 IMPLANT
CLOSURE WOUND 1/2 X4 (GAUZE/BANDAGES/DRESSINGS) ×1
CLOTH BEACON ORANGE TIMEOUT ST (SAFETY) ×3 IMPLANT
DRSG OPSITE POSTOP 4X10 (GAUZE/BANDAGES/DRESSINGS) ×3 IMPLANT
ELECT REM PT RETURN 9FT ADLT (ELECTROSURGICAL) ×3
ELECTRODE REM PT RTRN 9FT ADLT (ELECTROSURGICAL) ×1 IMPLANT
EXTRACTOR VACUUM KIWI (MISCELLANEOUS) IMPLANT
GLOVE BIOGEL M 7.0 STRL (GLOVE) ×6 IMPLANT
GLOVE BIOGEL PI IND STRL 7.0 (GLOVE) ×3 IMPLANT
GLOVE BIOGEL PI INDICATOR 7.0 (GLOVE) ×6
GOWN STRL REUS W/TWL LRG LVL3 (GOWN DISPOSABLE) ×9 IMPLANT
HOVERMATT SINGLE USE (MISCELLANEOUS) ×3 IMPLANT
KIT ABG SYR 3ML LUER SLIP (SYRINGE) IMPLANT
NEEDLE HYPO 25X5/8 SAFETYGLIDE (NEEDLE) IMPLANT
NS IRRIG 1000ML POUR BTL (IV SOLUTION) ×3 IMPLANT
PACK C SECTION WH (CUSTOM PROCEDURE TRAY) ×3 IMPLANT
PAD ABD 8X10 STRL (GAUZE/BANDAGES/DRESSINGS) ×3 IMPLANT
PAD OB MATERNITY 4.3X12.25 (PERSONAL CARE ITEMS) ×3 IMPLANT
PENCIL SMOKE EVAC W/HOLSTER (ELECTROSURGICAL) ×3 IMPLANT
RTRCTR C-SECT PINK 25CM LRG (MISCELLANEOUS) IMPLANT
SPONGE GAUZE 4X4 12PLY STER LF (GAUZE/BANDAGES/DRESSINGS) ×3 IMPLANT
STRIP CLOSURE SKIN 1/2X4 (GAUZE/BANDAGES/DRESSINGS) ×2 IMPLANT
SUT PDS AB 0 CT1 27 (SUTURE) ×6 IMPLANT
SUT PLAIN 0 NONE (SUTURE) IMPLANT
SUT VIC AB 0 CTX 36 (SUTURE) ×9
SUT VIC AB 0 CTX36XBRD ANBCTRL (SUTURE) ×3 IMPLANT
SUT VIC AB 2-0 CT1 27 (SUTURE) ×9
SUT VIC AB 2-0 CT1 TAPERPNT 27 (SUTURE) ×3 IMPLANT
SUT VIC AB 2-0 SH 27 (SUTURE) ×3
SUT VIC AB 2-0 SH 27XBRD (SUTURE) ×1 IMPLANT
SUT VIC AB 3-0 SH 27 (SUTURE)
SUT VIC AB 3-0 SH 27X BRD (SUTURE) IMPLANT
SUT VIC AB 4-0 KS 27 (SUTURE) ×3 IMPLANT
TAPE MEDIFIX FOAM 3 (GAUZE/BANDAGES/DRESSINGS) ×3 IMPLANT
TOWEL OR 17X24 6PK STRL BLUE (TOWEL DISPOSABLE) ×3 IMPLANT
TRAY FOLEY W/BAG SLVR 14FR LF (SET/KITS/TRAYS/PACK) ×3 IMPLANT
WATER STERILE IRR 1000ML POUR (IV SOLUTION) ×3 IMPLANT

## 2020-04-24 NOTE — Progress Notes (Signed)
Rebecca Banks is a 24 y.o. G1P0 at [redacted]w[redacted]d  IOL Preeclampsia MgSO4 infusing Labetalol IV protocol  Pitocin restarted 30 mins ago IUPC in place, MVU inadequate Called to room for FHR tracing  Subjective: Comfortable with epidural  Objective: BP (!) 144/97   Pulse (!) 114   Temp 98.4 F (36.9 C) (Oral)   Resp 17   Ht 5\' 4"  (1.626 m)   Wt 101.2 kg   BMI 38.31 kg/m  I/O last 3 completed shifts: In: 3935.2 [P.O.:1055; I.V.:2880.2] Out: 1750 [Urine:1750] Total I/O In: 62.5 [I.V.:62.5] Out: 100 [Urine:100]  FHT:  FHR 130s, mod variability. Repetitive late decels, absent accels UC:   regular, every 2-3 minute SVE:   Dilation: 4 Effacement (%): 80 Station: -2 Exam by:: Rebecca Banks  Labs: Lab Results  Component Value Date   WBC 17.9 (H) 04/24/2020   HGB 12.0 04/24/2020   HCT 36.3 04/24/2020   MCV 91.4 04/24/2020   PLT 269 04/24/2020    Assessment / Plan: Induction of labor due to preeclampsia,  progressing well on pitocin  Labor: No change in SVE despite attempts. Dr. 04/26/2020 notified of FHR tracing. CS called. Pitocin off. MD to bedside for discussion and consent Preeclampsia:  on magnesium sulfate, no signs or symptoms of toxicity, intake and ouput balanced and labs stable Fetal Wellbeing:  Category II Pain Control:  Epidural I/D:  n/a   Rebecca Banks Rebecca Banks 04/24/2020, 9:18 PM

## 2020-04-24 NOTE — Op Note (Signed)
Cesarean Section Procedure Note  Indications: non-reassuring fetal status  Pre-operative Diagnosis: 37 week 3 day pregnancy.  Post-operative Diagnosis: same  Surgeon: Gerald Leitz M.D.  Assistants: None  Anesthesia: Epidural anesthesia  ASA Class: 2   Procedure Details   The patient was seen in the Holding Room. The risks, benefits, complications, treatment options, and expected outcomes were discussed with the patient.  The patient concurred with the proposed plan, giving informed consent.  The site of surgery properly noted/marked. The patient was taken to Operating Room # c, identified as Rebecca Banks and the procedure verified as C-Section Delivery. A Time Out was held and the above information confirmed.  After induction of anesthesia, the patient was draped and prepped in the usual sterile manner. A Pfannenstiel incision was made and carried down through the subcutaneous tissue to the fascia. Fascial incision was made and extended transversely. The fascia was separated from the underlying rectus tissue superiorly and inferiorly. The peritoneum was identified and entered. Peritoneal incision was extended longitudinally. The utero-vesical peritoneal reflection was incised transversely and the bladder flap was bluntly freed from the lower uterine segment. A low transverse uterine incision was made. Delivered from cephalic presentation was a  Female with Apgar scores of 8 at one minute and 9 at five minutes. After the umbilical cord was clamped and cut cord blood was obtained for evaluation. The placenta was removed intact and appeared normal. The uterine outline, tubes and ovaries appeared normal. The uterine incision was closed with running locked sutures of 0 vicryl. A second layer of 0 vicryl was used to imbricate the incision . Hemostasis was observed. Lavage was carried out until clear. Interseed was placed along the hysterotomy incision.  The fascia was then reapproximated with running  sutures of 0 pds. The subcutaneous layer was reapproximated with 2-0 vicryl  The skin was reapproximated with 4-0 vicryl .  Instrument, sponge, and needle counts were correct prior the abdominal closure and at the conclusion of the case.   Findings: Female infant in the cephalic presentation nuchal cord x1. Normal appearing fallopian tubes and ovaries   Estimated Blood Loss:  372 mL         Drains: none         Total IV Fluids:  Per anesthesia ml         Specimens: Placenta and Disposition:  Sent to Pathology          Implants: None         Complications:  None; patient tolerated the procedure well.         Disposition: PACU - hemodynamically stable.         Condition: stable  Attending Attestation: I performed the procedure.

## 2020-04-24 NOTE — Anesthesia Preprocedure Evaluation (Signed)
Anesthesia Evaluation  Patient identified by MRN, date of birth, ID band Patient awake    Reviewed: Allergy & Precautions, H&P , NPO status , Patient's Chart, lab work & pertinent test results, reviewed documented beta blocker date and time   Airway Mallampati: II  TM Distance: >3 FB Neck ROM: full    Dental no notable dental hx. (+) Teeth Intact, Dental Advisory Given   Pulmonary neg pulmonary ROS, former smoker,    Pulmonary exam normal breath sounds clear to auscultation       Cardiovascular hypertension, Pt. on medications Normal cardiovascular exam Rhythm:regular Rate:Normal     Neuro/Psych negative neurological ROS  negative psych ROS   GI/Hepatic negative GI ROS, Neg liver ROS,   Endo/Other  Morbid obesity  Renal/GU negative Renal ROS  negative genitourinary   Musculoskeletal   Abdominal (+) + obese,   Peds  Hematology negative hematology ROS (+)   Anesthesia Other Findings   Reproductive/Obstetrics (+) Pregnancy                             Anesthesia Physical Anesthesia Plan  ASA: III  Anesthesia Plan: Epidural   Post-op Pain Management:    Induction:   PONV Risk Score and Plan:   Airway Management Planned: Natural Airway  Additional Equipment:   Intra-op Plan:   Post-operative Plan:   Informed Consent: I have reviewed the patients History and Physical, chart, labs and discussed the procedure including the risks, benefits and alternatives for the proposed anesthesia with the patient or authorized representative who has indicated his/her understanding and acceptance.       Plan Discussed with: Anesthesiologist  Anesthesia Plan Comments:         Anesthesia Quick Evaluation

## 2020-04-24 NOTE — Anesthesia Procedure Notes (Signed)
Epidural Patient location during procedure: OB Start time: 04/24/2020 11:55 AM End time: 04/24/2020 12:00 PM  Staffing Anesthesiologist: Bethena Midget, MD  Preanesthetic Checklist Completed: patient identified, IV checked, site marked, risks and benefits discussed, surgical consent, monitors and equipment checked, pre-op evaluation and timeout performed  Epidural Patient position: sitting Prep: DuraPrep and site prepped and draped Patient monitoring: continuous pulse ox and blood pressure Approach: midline Location: L3-L4 Injection technique: LOR air  Needle:  Needle type: Tuohy  Needle gauge: 17 G Needle length: 9 cm and 9 Needle insertion depth: 7 cm Catheter type: closed end flexible Catheter size: 19 Gauge Catheter at skin depth: 12 cm Test dose: negative  Assessment Events: blood not aspirated, injection not painful, no injection resistance, no paresthesia and negative IV test

## 2020-04-24 NOTE — Progress Notes (Signed)
Rebecca Banks is a 24 y.o. G1P0 at [redacted]w[redacted]d  IOL Preeclampsia MgSO4 infusing Labetalol IV protocol  Pitocin infusing Called to room s/p epidural placement for maternal hypotension and fetal bradycardia now corrected while on the phone with RN. Cat I tracing upon arrival to room  Subjective: Comfortable with epidural  Objective: BP 133/79   Pulse 88   Temp 97.6 F (36.4 C) (Oral)   Resp 16   Ht 5\' 4"  (1.626 m)   Wt 101.2 kg   BMI 38.31 kg/m  I/O last 3 completed shifts: In: 865.5 [P.O.:175; I.V.:690.5] Out: 900 [Urine:900] Total I/O In: 1127.4 [P.O.:440; I.V.:687.4] Out: 100 [Urine:100]  FHT:  FHR: 130 bpm, variability: moderate,  accelerations:  Abscent,  decelerations:  Absent UC:   regular, every 3 minutes SVE:   Dilation: 4 Effacement (%): 80 Station: -2 Exam by:: J Lashunda Greis CNM  Labs: Lab Results  Component Value Date   WBC 17.9 (H) 04/24/2020   HGB 12.0 04/24/2020   HCT 36.3 04/24/2020   MCV 91.4 04/24/2020   PLT 269 04/24/2020    Assessment / Plan: Induction of labor due to preeclampsia,  progressing well on pitocin  Labor: AROM complete at 1221. IUPC placed. Pitocin break x 30 mins of reassuring FHR tracing Preeclampsia:  on magnesium sulfate, no signs or symptoms of toxicity, intake and ouput balanced and labs stable Fetal Wellbeing:  Category I Pain Control:  Epidural I/D:  n/a Anticipated MOD:  NSVD  Kory Rains B Bryce Kimble 04/24/2020, 12:48 PM

## 2020-04-24 NOTE — Progress Notes (Signed)
Rebecca Banks is a 24 y.o. G1P0 at [redacted]w[redacted]d  IOL Preeclampsia MgSO4 infusing Labetalol IV protocol  Pitocin infusing IUPC in place, MVU inadequate Called to review FHR tracing Subjective: Comfortable with epidural  Objective: BP (!) 145/102   Pulse (!) 109   Temp 98.4 F (36.9 C) (Oral)   Resp 18   Ht 5\' 4"  (1.626 m)   Wt 101.2 kg   BMI 38.31 kg/m  I/O last 3 completed shifts: In: 865.5 [P.O.:175; I.V.:690.5] Out: 900 [Urine:900] Total I/O In: 2307.1 [P.O.:520; I.V.:1787.1] Out: 575 [Urine:575]  FHT:  FHR 130s, mod variability. Prolonged deceleration with good recovery UC:   regular, every 1 minute SVE:   Dilation: 4 Effacement (%): 80 Station: -2 Exam by:: A Showfety RN  Labs: Lab Results  Component Value Date   WBC 17.9 (H) 04/24/2020   HGB 12.0 04/24/2020   HCT 36.3 04/24/2020   MCV 91.4 04/24/2020   PLT 269 04/24/2020    Assessment / Plan: Induction of labor due to preeclampsia,  progressing well on pitocin  Labor: Allow for 1 hour pitocin break. Then restart pitocin at 2 mU until adequate MVUs Preeclampsia:  on magnesium sulfate, no signs or symptoms of toxicity, intake and ouput balanced and labs stable Fetal Wellbeing:  Category I Pain Control:  Epidural I/D:  n/a Anticipated MOD:  NSVD  Rebecca Banks B Rebecca Banks 04/24/2020, 3:53 PM

## 2020-04-24 NOTE — Progress Notes (Signed)
In to assess patient due to call regarding repetitive late decelertions. This is the 3 rd episode. Fetus has recovered with cessation of pitocin. Given the reoccurence recommend cesarean section. R/B/A of cesrean section discussed with the patient including but not limited to infection , bleeding, damage to bowel bladder baby with the need for further surgery. R/o transfusion discussed. Pt voiced understanding and desires to proceed.

## 2020-04-24 NOTE — Transfer of Care (Signed)
Immediate Anesthesia Transfer of Care Note  Patient: Rebecca Banks  Procedure(s) Performed: CESAREAN SECTION (N/A )  Patient Location: PACU  Anesthesia Type:Spinal  Level of Consciousness: awake, alert  and patient cooperative  Airway & Oxygen Therapy: Patient Spontanous Breathing  Post-op Assessment: Report given to RN and Post -op Vital signs reviewed and stable  Post vital signs: Reviewed and stable  Last Vitals:  Vitals Value Taken Time  BP 163/101 04/24/20 2330  Temp    Pulse 113 04/24/20 2333  Resp 25 04/24/20 2333  SpO2 100 % 04/24/20 2333  Vitals shown include unvalidated device data.  Last Pain:  Vitals:   04/24/20 2124  TempSrc: Oral  PainSc:          Complications: No complications documented.

## 2020-04-24 NOTE — Progress Notes (Addendum)
Rebecca Banks is a 24 y.o. G1P0 at [redacted]w[redacted]d  IOL Preeclampsia MgSO4 infusing Labetalol IV protocol  Pitocin restarted 30 mins ago IUPC in place, MVU inadequate  Subjective: Comfortable with epidural  Objective: BP (!) 156/94   Pulse (!) 106   Temp 98 F (36.7 C) (Oral)   Resp 18   Ht 5\' 4"  (1.626 m)   Wt 101.2 kg   BMI 38.31 kg/m  I/O last 3 completed shifts: In: 865.5 [P.O.:175; I.V.:690.5] Out: 900 [Urine:900] Total I/O In: 2591 [P.O.:660; I.V.:1931] Out: 675 [Urine:675]  FHT:  FHR 130s, mod variability. Absent decels, absent accels UC:   regular, every 1 minute SVE:   Dilation: 4 Effacement (%): 80 Station: -2 Exam by:: A Showfety RN  Labs: Lab Results  Component Value Date   WBC 17.9 (H) 04/24/2020   HGB 12.0 04/24/2020   HCT 36.3 04/24/2020   MCV 91.4 04/24/2020   PLT 269 04/24/2020    Assessment / Plan: Induction of labor due to preeclampsia,  progressing well on pitocin  Labor: Discussed plan of care with Dr. 04/26/2020 and reviewed series of FHR tracing. Continue with pitocin. Will proceed with CS if further FHR tracing abnormalities occur as she is remote from delivery Preeclampsia:  on magnesium sulfate, no signs or symptoms of toxicity, intake and ouput balanced and labs stable Fetal Wellbeing:  Category II Pain Control:  Epidural I/D:  n/a   Charnee Turnipseed B Carlee Tesfaye 04/24/2020, 5:32 PM

## 2020-04-25 ENCOUNTER — Encounter (HOSPITAL_COMMUNITY): Payer: Self-pay | Admitting: Obstetrics & Gynecology

## 2020-04-25 DIAGNOSIS — O9902 Anemia complicating childbirth: Secondary | ICD-10-CM

## 2020-04-25 DIAGNOSIS — O1490 Unspecified pre-eclampsia, unspecified trimester: Secondary | ICD-10-CM | POA: Diagnosis present

## 2020-04-25 DIAGNOSIS — Z98891 History of uterine scar from previous surgery: Secondary | ICD-10-CM

## 2020-04-25 LAB — CBC
HCT: 29.9 % — ABNORMAL LOW (ref 36.0–46.0)
HCT: 33.2 % — ABNORMAL LOW (ref 36.0–46.0)
Hemoglobin: 11.1 g/dL — ABNORMAL LOW (ref 12.0–15.0)
Hemoglobin: 9.9 g/dL — ABNORMAL LOW (ref 12.0–15.0)
MCH: 30.9 pg (ref 26.0–34.0)
MCH: 30.9 pg (ref 26.0–34.0)
MCHC: 33.1 g/dL (ref 30.0–36.0)
MCHC: 33.4 g/dL (ref 30.0–36.0)
MCV: 92.5 fL (ref 80.0–100.0)
MCV: 93.4 fL (ref 80.0–100.0)
Platelets: 236 10*3/uL (ref 150–400)
Platelets: 243 10*3/uL (ref 150–400)
RBC: 3.2 MIL/uL — ABNORMAL LOW (ref 3.87–5.11)
RBC: 3.59 MIL/uL — ABNORMAL LOW (ref 3.87–5.11)
RDW: 13.2 % (ref 11.5–15.5)
RDW: 13.3 % (ref 11.5–15.5)
WBC: 22.6 10*3/uL — ABNORMAL HIGH (ref 4.0–10.5)
WBC: 24.9 10*3/uL — ABNORMAL HIGH (ref 4.0–10.5)
nRBC: 0.1 % (ref 0.0–0.2)
nRBC: 0.2 % (ref 0.0–0.2)

## 2020-04-25 LAB — COMPREHENSIVE METABOLIC PANEL
ALT: 16 U/L (ref 0–44)
AST: 28 U/L (ref 15–41)
Albumin: 2 g/dL — ABNORMAL LOW (ref 3.5–5.0)
Alkaline Phosphatase: 138 U/L — ABNORMAL HIGH (ref 38–126)
Anion gap: 9 (ref 5–15)
BUN: 5 mg/dL — ABNORMAL LOW (ref 6–20)
CO2: 21 mmol/L — ABNORMAL LOW (ref 22–32)
Calcium: 7.6 mg/dL — ABNORMAL LOW (ref 8.9–10.3)
Chloride: 104 mmol/L (ref 98–111)
Creatinine, Ser: 0.99 mg/dL (ref 0.44–1.00)
GFR calc Af Amer: >60 mL/min (ref >60)
GFR calc non Af Amer: >60 mL/min (ref >60)
Glucose, Bld: 121 mg/dL — ABNORMAL HIGH (ref 70–99)
Potassium: 4.1 mmol/L (ref 3.5–5.1)
Sodium: 134 mmol/L — ABNORMAL LOW (ref 135–145)
Total Bilirubin: 0.5 mg/dL (ref 0.3–1.2)
Total Protein: 4.9 g/dL — ABNORMAL LOW (ref 6.5–8.1)

## 2020-04-25 LAB — MAGNESIUM: Magnesium: 5.6 mg/dL — ABNORMAL HIGH (ref 1.7–2.4)

## 2020-04-25 MED ORDER — HYDROMORPHONE HCL 1 MG/ML IJ SOLN
INTRAMUSCULAR | Status: AC
  Filled 2020-04-25: qty 0.5

## 2020-04-25 MED ORDER — LABETALOL HCL 5 MG/ML IV SOLN
INTRAVENOUS | Status: AC
  Filled 2020-04-25: qty 4

## 2020-04-25 MED ORDER — OXYTOCIN-SODIUM CHLORIDE 30-0.9 UT/500ML-% IV SOLN
INTRAVENOUS | Status: AC
  Filled 2020-04-25: qty 500

## 2020-04-25 MED ORDER — MORPHINE SULFATE (PF) 10 MG/ML IV SOLN
INTRAVENOUS | Status: DC | PRN
Start: 2020-04-24 — End: 2020-04-25
  Administered 2020-04-24: 3 mg via EPIDURAL

## 2020-04-25 MED ORDER — KETOROLAC TROMETHAMINE 30 MG/ML IJ SOLN
INTRAMUSCULAR | Status: AC
  Filled 2020-04-25: qty 1

## 2020-04-25 MED ORDER — LABETALOL HCL 5 MG/ML IV SOLN
10.0000 mg | INTRAVENOUS | Status: AC | PRN
  Administered 2020-04-25 (×5): 10 mg via INTRAVENOUS

## 2020-04-25 MED ORDER — POLYSACCHARIDE IRON COMPLEX 150 MG PO CAPS
150.0000 mg | ORAL_CAPSULE | Freq: Every day | ORAL | Status: DC
  Administered 2020-04-25 – 2020-04-26 (×2): 150 mg via ORAL
  Filled 2020-04-25 (×2): qty 1

## 2020-04-25 NOTE — Progress Notes (Signed)
Subjective: POD# 1 Live born female  Birth Weight: 6 lb 5.2 oz (2870 g) APGAR: 6, 8  Newborn Delivery   Birth date/time: 04/24/2020 22:20:00 Delivery type: C-Section, Low Transverse Trial of labor: Yes C-section categorization: Primary     Baby name: Rajon Delivering provider: Gerald Leitz   Circumcision: Yes, planning Feeding: bottle  Pain control at delivery: Epidural   Reports feeling well. Some pain with movement but otherwise well controlled. Denies headache, epigastric pain, or visual changes. Not breastfeeding.   Patient reports tolerating PO.   Breast symptoms: None Pain controlled with narcotic analgesics including Oxy IR Denies HA/SOB/C/P/N/V/dizziness. Flatus no. She reports vaginal bleeding as normal, without clots.  She is ambulating, urinating without difficulty.     Objective:   VS:    Vitals:   04/25/20 0920 04/25/20 1015 04/25/20 1115 04/25/20 1116  BP:    123/68  Pulse:    (!) 104  Resp: 18 18 18    Temp:   98.1 F (36.7 C)   TempSrc:   Oral   SpO2:    100%  Weight:      Height:        Intake/Output Summary (Last 24 hours) at 04/25/2020 1206 Last data filed at 04/25/2020 1100 Gross per 24 hour  Intake 4585.92 ml  Output 4649 ml  Net -63.08 ml      Recent Labs    04/25/20 0002 04/25/20 0717  WBC 22.6* 24.9*  HGB 11.1* 9.9*  HCT 33.2* 29.9*  PLT 243 236    Blood type: --/--/B POS (09/24 1648)  Rubella: Immune (04/09 0000)    Physical Exam:  General: alert, cooperative and appears stated age CV: Regular rate and rhythm Resp: clear Abdomen: soft, nontender, normal bowel sounds Incision: clean, dry and intact Uterine Fundus: firm, below umbilicus, nontender Lochia: moderate Ext: extremities normal, atraumatic, no cyanosis or edema  Assessment/Plan: 24 y.o.   POD# 1. G1P1001                  Principal Problem:   Postpartum care following cesarean delivery 9/25 Active Problems:   Status post primary low transverse cesarean section  9/25  Advance diet as tolerated  Encourage rest when baby rests  Encourage to ambulate  Routine post-op care   Preeclampsia   Continue mag sulfate until 2220  May discontinue foley catheter if patient is able to ambulate and maintain strict I&Os  BP stable, will monitor need for antihypertensive medication   Failed induction of labor, delivered   Maternal anemia, with delivery  Will start Niferex for anemia            2221, CNM, MSN 04/25/2020, 12:06 PM

## 2020-04-25 NOTE — Plan of Care (Signed)
Continued plan of care discussed with patient.Teaching completed.

## 2020-04-25 NOTE — Anesthesia Postprocedure Evaluation (Signed)
Anesthesia Post Note  Patient: Rebecca Banks  Procedure(s) Performed: CESAREAN SECTION (N/A )     Patient location during evaluation: PACU Anesthesia Type: Epidural Level of consciousness: oriented and awake and alert Pain management: pain level controlled Vital Signs Assessment: post-procedure vital signs reviewed and stable Respiratory status: spontaneous breathing and respiratory function stable Cardiovascular status: blood pressure returned to baseline and stable Postop Assessment: no headache, no backache, no apparent nausea or vomiting, epidural receding and patient able to bend at knees Anesthetic complications: no Comments: Chronic HTN resistant to multiple medications, poorly controlled at baseline. Diastolic BP finally <100 after multiple doses of labetalol in PACU. BP will continue to be treated upon transfer to [facility] specialty care.    No complications documented.  Last Vitals:  Vitals:   04/25/20 0109 04/25/20 0110  BP:    Pulse: 100 (!) 101  Resp: (!) 24 (!) 21  Temp:    SpO2: 99% 98%    Last Pain:  Vitals:   04/25/20 0029  TempSrc:   PainSc: 2    Pain Goal:                Epidural/Spinal Function Cutaneous sensation: Tingles (04/25/20 0045), Patient able to flex knees: Yes (04/25/20 0045), Patient able to lift hips off bed: No (04/25/20 0045), Back pain beyond tenderness at insertion site: No (04/25/20 0045), Progressively worsening motor and/or sensory loss: No (04/25/20 0045), Bowel and/or bladder incontinence post epidural: No (04/25/20 0045)  Lannie Fields

## 2020-04-26 ENCOUNTER — Encounter (HOSPITAL_COMMUNITY): Payer: Self-pay | Admitting: Obstetrics & Gynecology

## 2020-04-26 MED ORDER — OXYCODONE HCL 5 MG PO TABS: 5.0000 mg | ORAL_TABLET | Freq: Four times a day (QID) | ORAL | 0 refills | Status: DC | PRN

## 2020-04-26 MED ORDER — IBUPROFEN 800 MG PO TABS: 800.0000 mg | ORAL_TABLET | Freq: Three times a day (TID) | ORAL | 0 refills | Status: AC

## 2020-04-26 MED ORDER — ACETAMINOPHEN 325 MG PO TABS: 650.0000 mg | ORAL_TABLET | ORAL | Status: AC | PRN

## 2020-04-26 NOTE — Discharge Summary (Signed)
Postpartum Discharge Summary  Date of Service updated 04/26/20     Patient Name: Rebecca Banks DOB: June 05, 1996 MRN: [id number]  Date of admission: 04/23/2020 Delivery date:04/24/2020  Delivering provider: Christophe Louis  Date of discharge: 04/26/2020  Admitting diagnosis: Encounter for induction of labor [Z34.90] Intrauterine pregnancy: [redacted]w[redacted]d    Secondary diagnosis:  Principal Problem:   Postpartum care following cesarean delivery 9/25 Active Problems:   Status post primary low transverse cesarean section 9/25   Preeclampsia   Failed induction of labor, delivered   Maternal anemia, with delivery  Additional problems: none    Discharge diagnosis: Term Pregnancy Delivered, Preeclampsia (severe) and Anemia                                              Post partum procedures:Magnesium sulfate Augmentation: AROM, Pitocin, Cytotec and IP Foley Complications: None  Hospital course: Induction of Labor With Cesarean Section   24y.o. yo G1P1001 at 391w3das admitted to the hospital 04/23/2020 for induction of labor. Patient had a labor course significant for pre-eclampsia with magnesium sulfate infusion in labor. The patient went for cesarean section due to Non-Reassuring FHR and repetative late decelerations. Delivery details are as follows: Membrane Rupture Time/Date: 12:21 PM ,04/24/2020   Delivery Method:C-Section, Low Transverse  Details of operation can be found in separate operative Note.  Patient had an uncomplicated postpartum course. She is ambulating, tolerating a regular diet, passing flatus, and urinating well.  Patient is discharged home in stable condition on 04/26/20.      Newborn Data: Birth date:04/24/2020  Birth time:10:20 PM  Gender:Female  Living status:Living  Apgars:6 ,8  Weight:2870 g                                 Magnesium Sulfate received: Yes: Seizure prophylaxis BMZ received: No Rhophylac:N/A MMR:N/A Transfusion:No  Physical exam  Vitals:   04/25/20 1951  04/25/20 2343 04/26/20 0412 04/26/20 0834  BP: (!) 122/57 (!) 149/78 (!) 143/82 (!) 151/90  Pulse: (!) 104 99 100 (!) 107  Resp: 16 15 17 18   Temp:   98.2 F (36.8 C) 98.7 F (37.1 C)  TempSrc:   Oral Oral  SpO2: 98% 99% 98%   Weight:      Height:       General: alert, cooperative and no distress Lochia: appropriate Uterine Fundus: firm Incision: Healing well with no significant drainage, new honeycomb dressing applied DVT Evaluation: No evidence of DVT seen on physical exam. No cords or calf tenderness. No significant calf/ankle edema. Labs: Lab Results  Component Value Date   WBC 24.9 (H) 04/25/2020   HGB 9.9 (L) 04/25/2020   HCT 29.9 (L) 04/25/2020   MCV 93.4 04/25/2020   PLT 236 04/25/2020   CMP Latest Ref Rng & Units 04/25/2020  Glucose 70 - 99 mg/dL 121(H)  BUN 6 - 20 mg/dL 5(L)  Creatinine 0.44 - 1.00 mg/dL 0.99  Sodium 135 - 145 mmol/L 134(L)  Potassium 3.5 - 5.1 mmol/L 4.1  Chloride 98 - 111 mmol/L 104  CO2 22 - 32 mmol/L 21(L)  Calcium 8.9 - 10.3 mg/dL 7.6(L)  Total Protein 6.5 - 8.1 g/dL 4.9(L)  Total Bilirubin 0.3 - 1.2 mg/dL 0.5  Alkaline Phos 38 - 126 U/L 138(H)  AST 15 - 41 U/L 28  ALT 0 - 44 U/L 16   Edinburgh Score: Edinburgh Postnatal Depression Scale Screening Tool 04/25/2020  I have been able to laugh and see the funny side of things. 0  I have looked forward with enjoyment to things. 0  I have blamed myself unnecessarily when things went wrong. 0  I have been anxious or worried for no good reason. 0  I have felt scared or panicky for no good reason. 0  Things have been getting on top of me. 0  I have been so unhappy that I have had difficulty sleeping. 0  I have felt sad or miserable. 0  I have been so unhappy that I have been crying. 0  The thought of harming myself has occurred to me. 0  Edinburgh Postnatal Depression Scale Total 0      After visit meds:  Allergies as of 04/26/2020   No Known Allergies     Medication List    TAKE  these medications   acetaminophen 325 MG tablet Commonly known as: TYLENOL Take 2 tablets (650 mg total) by mouth every 4 (four) hours as needed for mild pain (temperature > 101.5.).   ibuprofen 800 MG tablet Commonly known as: ADVIL Take 1 tablet (800 mg total) by mouth every 8 (eight) hours.   oxyCODONE 5 MG immediate release tablet Commonly known as: Oxy IR/ROXICODONE Take 1 tablet (5 mg total) by mouth every 6 (six) hours as needed for up to 3 days for moderate pain, severe pain or breakthrough pain.   prenatal multivitamin Tabs tablet Take 1 tablet by mouth daily at 12 noon.        Discharge home in stable condition Infant Feeding: Bottle Infant Disposition:home with mother Discharge instruction: per After Visit Summary and Postpartum booklet. Activity: Advance as tolerated. Pelvic rest for 6 weeks.  Diet: low salt diet Anticipated Birth Control: Unsure Postpartum Appointment:6 weeks Additional Postpartum F/U: Incision check 1 week and BP check 1 week Future Appointments:No future appointments. Follow up Visit:  [facility]. Schedule an appointment as soon as possible for a visit in 1 week(s).   Specialty: Obstetrics and Gynecology Why: Return to [facility] in 1 week for blood pressure check and incision check and then in 6 weeks for regular postpartum visit.  Contact information: [address]. [address] [place] [address] [phone]                  04/26/2020 Arrie Eastern, CNM

## 2020-04-27 LAB — SURGICAL PATHOLOGY

## 2020-04-27 NOTE — Progress Notes (Signed)
Telephone encounter with patient: Pt called [facility] unit requesting for oxycodone prescription she left to be called to pharmacy. Prescription unable to be called to pharmacy per on call CNM. Patient notified and accepting, requesting RN throw away Rx.  Rx shredded.

## 2020-04-28 ENCOUNTER — Inpatient Hospital Stay (HOSPITAL_COMMUNITY)
Admission: AD | Admit: 2020-04-28 | Discharge: 2020-04-28 | Disposition: A | Discharge status: Home / Self Care | Payer: Medicaid Other | Attending: Obstetrics and Gynecology | Admitting: Obstetrics and Gynecology

## 2020-04-28 ENCOUNTER — Encounter (HOSPITAL_COMMUNITY): Payer: Self-pay | Admitting: Obstetrics and Gynecology

## 2020-04-28 ENCOUNTER — Other Ambulatory Visit: Payer: Self-pay

## 2020-04-28 DIAGNOSIS — O99893 Other specified diseases and conditions complicating puerperium: Secondary | ICD-10-CM | POA: Diagnosis not present

## 2020-04-28 DIAGNOSIS — R609 Edema, unspecified: Secondary | ICD-10-CM | POA: Insufficient documentation

## 2020-04-28 DIAGNOSIS — O26893 Other specified pregnancy related conditions, third trimester: Secondary | ICD-10-CM | POA: Diagnosis not present

## 2020-04-28 DIAGNOSIS — Z87891 Personal history of nicotine dependence: Secondary | ICD-10-CM | POA: Diagnosis not present

## 2020-04-28 DIAGNOSIS — F1290 Cannabis use, unspecified, uncomplicated: Secondary | ICD-10-CM | POA: Insufficient documentation

## 2020-04-28 DIAGNOSIS — G8918 Other acute postprocedural pain: Secondary | ICD-10-CM | POA: Diagnosis not present

## 2020-04-28 DIAGNOSIS — O169 Unspecified maternal hypertension, unspecified trimester: Secondary | ICD-10-CM

## 2020-04-28 DIAGNOSIS — O99323 Drug use complicating pregnancy, third trimester: Secondary | ICD-10-CM | POA: Insufficient documentation

## 2020-04-28 DIAGNOSIS — O163 Unspecified maternal hypertension, third trimester: Secondary | ICD-10-CM | POA: Diagnosis not present

## 2020-04-28 DIAGNOSIS — Z3A37 37 weeks gestation of pregnancy: Secondary | ICD-10-CM | POA: Diagnosis not present

## 2020-04-28 DIAGNOSIS — O165 Unspecified maternal hypertension, complicating the puerperium: Secondary | ICD-10-CM

## 2020-04-28 DIAGNOSIS — L7682 Other postprocedural complications of skin and subcutaneous tissue: Secondary | ICD-10-CM

## 2020-04-28 HISTORY — DX: Essential (primary) hypertension: I10

## 2020-04-28 HISTORY — DX: Gestational (pregnancy-induced) hypertension without significant proteinuria, unspecified trimester: O13.9

## 2020-04-28 LAB — COMPREHENSIVE METABOLIC PANEL
ALT: 18 U/L (ref 0–44)
AST: 18 U/L (ref 15–41)
Albumin: 2.2 g/dL — ABNORMAL LOW (ref 3.5–5.0)
Alkaline Phosphatase: 125 U/L (ref 38–126)
Anion gap: 8 (ref 5–15)
BUN: 6 mg/dL (ref 6–20)
CO2: 23 mmol/L (ref 22–32)
Calcium: 8.4 mg/dL — ABNORMAL LOW (ref 8.9–10.3)
Chloride: 106 mmol/L (ref 98–111)
Creatinine, Ser: 0.9 mg/dL (ref 0.44–1.00)
GFR calc Af Amer: >60 mL/min (ref >60)
GFR calc non Af Amer: >60 mL/min (ref >60)
Glucose, Bld: 92 mg/dL (ref 70–99)
Potassium: 4 mmol/L (ref 3.5–5.1)
Sodium: 137 mmol/L (ref 135–145)
Total Bilirubin: 0.4 mg/dL (ref 0.3–1.2)
Total Protein: 6 g/dL — ABNORMAL LOW (ref 6.5–8.1)

## 2020-04-28 LAB — CBC
HCT: 30.1 % — ABNORMAL LOW (ref 36.0–46.0)
Hemoglobin: 9.7 g/dL — ABNORMAL LOW (ref 12.0–15.0)
MCH: 30.7 pg (ref 26.0–34.0)
MCHC: 32.2 g/dL (ref 30.0–36.0)
MCV: 95.3 fL (ref 80.0–100.0)
Platelets: 308 10*3/uL (ref 150–400)
RBC: 3.16 MIL/uL — ABNORMAL LOW (ref 3.87–5.11)
RDW: 13.3 % (ref 11.5–15.5)
WBC: 15.1 10*3/uL — ABNORMAL HIGH (ref 4.0–10.5)
nRBC: 0.7 % — ABNORMAL HIGH (ref 0.0–0.2)

## 2020-04-28 MED ORDER — NIFEDIPINE ER 30 MG PO TB24: 30.0000 mg | ORAL_TABLET | Freq: Every day | ORAL | 0 refills | Status: AC

## 2020-04-28 MED ORDER — OXYCODONE HCL 5 MG PO TABS
5.0000 mg | ORAL_TABLET | ORAL | 0 refills | Status: DC | PRN
Start: 2020-04-28 — End: 2020-04-28

## 2020-04-28 MED ORDER — NIFEDIPINE ER OSMOTIC RELEASE 30 MG PO TB24
30.0000 mg | ORAL_TABLET | Freq: Every day | ORAL | Status: DC
  Administered 2020-04-28: 30 mg via ORAL
  Filled 2020-04-28: qty 1

## 2020-04-28 MED ORDER — BUTALBITAL-APAP-CAFFEINE 50-325-40 MG PO TABS
1.0000 | ORAL_TABLET | Freq: Four times a day (QID) | ORAL | Status: DC | PRN
  Administered 2020-04-28: 1 via ORAL
  Filled 2020-04-28: qty 1

## 2020-04-28 MED ORDER — OXYCODONE HCL 5 MG PO TABS
5.0000 mg | ORAL_TABLET | Freq: Once | ORAL | Status: AC
  Administered 2020-04-28: 5 mg via ORAL
  Filled 2020-04-28: qty 1

## 2020-04-28 MED ORDER — OXYCODONE HCL 5 MG PO TABS
5.0000 mg | ORAL_TABLET | ORAL | 0 refills | Status: AC | PRN
Start: 2020-04-28 — End: ?

## 2020-04-28 NOTE — MAU Note (Signed)
Pp- c/s on 9/25. Pt c/o pain on the right side of her incision. Stated her b/p was up at home and has a headache and her legs are swollen from the knees down. Has taken ibuprofen  for pain but has not helped much.  Pt feels SOB with exertion .

## 2020-04-28 NOTE — MAU Provider Note (Signed)
History     CSN: [id number]  Arrival date and time: 04/28/20 1054   First Provider Initiated Contact with Patient 04/28/20 1142      Chief Complaint  Patient presents with  . Hypertension  . Headache  . Wound Check   24 y.o. G1P1 s/p primary CS for failed IOL for gHTN at 37 wks presenting with leg swelling, incisional pain, and HA. Leg swelling started a few days ago. Located bilateral calfs and feet. Causes pain when she walks. No redness or lumps. Denies CP. Some SOB with she ambulates. Incisioal pain started once she was home. Worse on right side. Denies drainage from incision. Denies fevers. She was only given Ibuprofen and used twice yesterday with little relief. HA started this am. Located frontal. Rates 6/10. Has not taken anything for it. Denies visual disturbances and RUQ pain. She is not taking anti-hypertensives.    OB History    Gravida  1   Para  1   Term  1   Preterm      AB      Living  1     SAB      TAB      Ectopic      Multiple  0   Live Births  1           Past Medical History:  Diagnosis Date  . Hypertension   . Pregnancy induced hypertension     Past Surgical History:  Procedure Laterality Date  . CESAREAN SECTION N/A 04/24/2020   Procedure: CESAREAN SECTION;  Surgeon: Gerald Leitz, MD;  Location: [facility] LD ORS;  Service: Obstetrics;  Laterality: N/A;  Primary Cesarean Section nonreassuring fetal heart tracing    History reviewed. No pertinent family history.  Social History   Tobacco Use  . Smoking status: Former Furniture conservator/restorer  . Vaping Use: Unknown  Substance Use Topics  . Alcohol use: Not Currently  . Drug use: Yes    Types: Marijuana    Allergies: No Known Allergies  Medications Prior to Admission  Medication Sig Dispense Refill Last Dose  . ibuprofen (ADVIL) 800 MG tablet Take 1 tablet (800 mg total) by mouth every 8 (eight) hours. 30 tablet 0 04/28/2020 at Unknown time  . acetaminophen (TYLENOL) 325 MG tablet Take  2 tablets (650 mg total) by mouth every 4 (four) hours as needed for mild pain (temperature > 101.5.).     Marland Kitchen Prenatal Vit-Fe Fumarate-FA (PRENATAL MULTIVITAMIN) TABS tablet Take 1 tablet by mouth daily at 12 noon.     . [DISCONTINUED] oxyCODONE (OXY IR/ROXICODONE) 5 MG immediate release tablet Take 1 tablet (5 mg total) by mouth every 6 (six) hours as needed for up to 3 days for moderate pain, severe pain or breakthrough pain. 12 tablet 0     Review of Systems  Constitutional: Negative for chills and fever.  Eyes: Negative for visual disturbance.  Respiratory: Positive for shortness of breath.   Cardiovascular: Positive for leg swelling. Negative for chest pain.  Gastrointestinal: Positive for abdominal pain. Negative for constipation, diarrhea, nausea and vomiting.  Genitourinary: Positive for vaginal bleeding.  Neurological: Positive for headaches.   Physical Exam   Blood pressure (!) 163/89, pulse (!) 111, temperature 99 F (37.2 C), resp. rate 18, SpO2 100 %, unknown if currently breastfeeding. Patient Vitals for the past 24 hrs:  BP Temp Pulse Resp SpO2  04/28/20 1416 (!) 163/89 -- (!) 111 -- --  04/28/20 1400 (!) 154/93 -- (!) 113 -- --  04/28/20 1345 (!) 154/95 -- (!) 108 -- --  04/28/20 1330 (!) 159/78 -- 99 -- --  04/28/20 1315 (!) 153/84 -- 94 -- --  04/28/20 1300 (!) 161/83 -- (!) 103 -- --  04/28/20 1245 (!) 147/84 -- (!) 102 -- --  04/28/20 1230 (!) 160/88 -- 99 -- --  04/28/20 1215 (!) 159/104 -- (!) 106 -- --  04/28/20 1211 (!) 154/102 -- 100 -- --  04/28/20 1145 (!) 157/85 -- (!) 109 -- --  04/28/20 1135 -- -- -- -- 100 %  04/28/20 1132 (!) 162/89 -- (!) 110 -- --  04/28/20 1129 -- -- -- -- 100 %  04/28/20 1110 (!) 158/95 99 F (37.2 C) (!) 114 18 100 %    Physical Exam Vitals and nursing note reviewed.  Constitutional:      General: She is not in acute distress.    Appearance: Normal appearance.  HENT:     Head: Normocephalic and atraumatic.   Cardiovascular:     Rate and Rhythm: Regular rhythm. Tachycardia present.  Pulmonary:     Effort: Pulmonary effort is normal. No respiratory distress.     Breath sounds: No stridor. No wheezing, rhonchi or rales.  Abdominal:     General: There is no distension.     Palpations: Abdomen is soft.     Tenderness: There is no abdominal tenderness.     Comments: Incision well approximated with steri strips, no erythema or drainage, honeycomb removed d/t lifting  Musculoskeletal:        General: Swelling (3+ BLE) present. No deformity. Normal range of motion.     Cervical back: Normal range of motion.  Skin:    General: Skin is warm and dry.  Neurological:     General: No focal deficit present.     Mental Status: She is alert and oriented to person, place, and time.  Psychiatric:        Mood and Affect: Mood normal.        Behavior: Behavior normal.    Results for orders placed or performed during the hospital encounter of 04/28/20 (from the past 24 hour(s))  CBC     Status: Abnormal   Collection Time: 04/28/20 11:29 AM  Result Value Ref Range   WBC 15.1 (H) 4.0 - 10.5 K/uL   RBC 3.16 (L) 3.87 - 5.11 MIL/uL   Hemoglobin 9.7 (L) 12.0 - 15.0 g/dL   HCT 30.8 (L) 36 - 46 %   MCV 95.3 80.0 - 100.0 fL   MCH 30.7 26.0 - 34.0 pg   MCHC 32.2 30.0 - 36.0 g/dL   RDW 65.7 84.6 - 96.2 %   Platelets 308 150 - 400 K/uL   nRBC 0.7 (H) 0.0 - 0.2 %  Comprehensive metabolic panel     Status: Abnormal   Collection Time: 04/28/20 11:29 AM  Result Value Ref Range   Sodium 137 135 - 145 mmol/L   Potassium 4.0 3.5 - 5.1 mmol/L   Chloride 106 98 - 111 mmol/L   CO2 23 22 - 32 mmol/L   Glucose, Bld 92 70 - 99 mg/dL   BUN 6 6 - 20 mg/dL   Creatinine, Ser 9.52 0.44 - 1.00 mg/dL   Calcium 8.4 (L) 8.9 - 10.3 mg/dL   Total Protein 6.0 (L) 6.5 - 8.1 g/dL   Albumin 2.2 (L) 3.5 - 5.0 g/dL   AST 18 15 - 41 U/L   ALT 18 0 - 44 U/L   Alkaline  Phosphatase 125 38 - 126 U/L   Total Bilirubin 0.4 0.3 - 1.2  mg/dL   GFR calc non Af Amer >60 >60 mL/min   GFR calc Af Amer >60 >60 mL/min   Anion gap 8 5 - 15   MAU Course  Procedures Oxycodone Fioricet Dressing replaced  MDM Labs ordered and reviewed. Labs normal. HA resolved. Incisional pain improved. No signs of DVT. Consult with Dr. Vergie Living, ok for outpt management, start Procardia, and arrange BP check in 2 days. Stable for discharge home.   Assessment and Plan   1. Hypertension in pregnancy, delivered   2. Dependent edema   3. Incisional pain    Discharge home Follow up at [facility] in 2 days for BP check- [place], CNM notified Strict return precautions Rx Procardia 30 mg XL Rx Oxycodone 5 mg #20  Allergies as of 04/28/2020   No Known Allergies     Medication List    TAKE these medications   acetaminophen 325 MG tablet Commonly known as: TYLENOL Take 2 tablets (650 mg total) by mouth every 4 (four) hours as needed for mild pain (temperature > 101.5.).   ibuprofen 800 MG tablet Commonly known as: ADVIL Take 1 tablet (800 mg total) by mouth every 8 (eight) hours.   NIFEdipine 30 MG 24 hr tablet Commonly known as: ADALAT CC Take 1 tablet (30 mg total) by mouth daily.   oxyCODONE 5 MG immediate release tablet Commonly known as: Oxy IR/ROXICODONE Take 1 tablet (5 mg total) by mouth every 4 (four) hours as needed for moderate pain, severe pain or breakthrough pain. What changed: when to take this   prenatal multivitamin Tabs tablet Take 1 tablet by mouth daily at 12 noon.      Donette Larry, CNM 04/28/2020, 2:20 PM

## 2020-04-28 NOTE — Discharge Instructions (Signed)
